# Patient Record
Sex: Male | Born: 1990 | Race: White | Hispanic: No | Marital: Single | Smoking: Former smoker
Health system: Southern US, Community
[De-identification: ages and names within clinical notes are randomized; demographics above are authoritative.]

## PROBLEM LIST (undated history)

## (undated) DIAGNOSIS — F909 Attention-deficit hyperactivity disorder, unspecified type: Secondary | ICD-10-CM

## (undated) DIAGNOSIS — T7840XA Allergy, unspecified, initial encounter: Secondary | ICD-10-CM

## (undated) DIAGNOSIS — F419 Anxiety disorder, unspecified: Secondary | ICD-10-CM

## (undated) HISTORY — DX: Anxiety disorder, unspecified: F41.9

## (undated) HISTORY — DX: Attention-deficit hyperactivity disorder, unspecified type: F90.9

## (undated) HISTORY — PX: EAR TUBE REMOVAL: SHX1486

## (undated) HISTORY — DX: Allergy, unspecified, initial encounter: T78.40XA

---

## 2018-07-21 DEATH — deceased

## 2019-09-06 ENCOUNTER — Ambulatory Visit (INDEPENDENT_AMBULATORY_CARE_PROVIDER_SITE_OTHER): Payer: BC Managed Care – PPO | Admitting: Internal Medicine

## 2019-09-06 ENCOUNTER — Encounter: Payer: Self-pay | Admitting: Internal Medicine

## 2019-09-06 ENCOUNTER — Other Ambulatory Visit: Payer: Self-pay

## 2019-09-06 VITALS — BP 112/72 | HR 67 | Ht 74.0 in | Wt 180.8 lb

## 2019-09-06 DIAGNOSIS — J3089 Other allergic rhinitis: Secondary | ICD-10-CM

## 2019-09-06 DIAGNOSIS — B354 Tinea corporis: Secondary | ICD-10-CM | POA: Diagnosis not present

## 2019-09-06 DIAGNOSIS — Z23 Encounter for immunization: Secondary | ICD-10-CM

## 2019-09-06 DIAGNOSIS — T781XXA Other adverse food reactions, not elsewhere classified, initial encounter: Secondary | ICD-10-CM

## 2019-09-06 MED ORDER — NYSTATIN 100000 UNIT/GM EX OINT: 1.0000 "application " | TOPICAL_OINTMENT | Freq: Two times a day (BID) | CUTANEOUS | 0 refills | Status: AC

## 2019-09-06 NOTE — Patient Instructions (Signed)
Continue Zyrtec daily  Can also use Flonase nasal spray daily as well

## 2019-09-06 NOTE — Progress Notes (Signed)
Date:  09/06/2019   Name:  Tony Hale   DOB:  03/16/1991   MRN:  [id number]   Chief Complaint: Establish Care (Flu shot. ), Fungus of Anus ([facility] treated in past with cream. Helped in the past. Itching on and off. No pain. ), and Allergies (Zyrtec helps. Went from seasonal allergies to year round. Has a lot of phlegm in the back of throat, nose running, and wet coughs. )  Sinus Problem This is a recurrent problem. There has been no fever. Associated symptoms include congestion, coughing and sinus pressure. Pertinent negatives include no chills, headaches, sneezing or sore throat. (Runny nose and post nasal drip) Treatments tried: anti-histamines.  Rash This is a recurrent problem. Location: rectum. The rash is characterized by itchiness. Associated symptoms include congestion, coughing and rhinorrhea. Pertinent negatives include no fatigue, fever or sore throat. Treatments tried: topical cream for fungus. The treatment provided significant relief. His past medical history is significant for allergies.    No results found for: CREATININE, BUN, NA, K, CL, CO2 No results found for: CHOL, HDL, LDLCALC, LDLDIRECT, TRIG, CHOLHDL No results found for: TSH No results found for: HGBA1C   Review of Systems  Constitutional: Negative for chills, fatigue and fever.  HENT: Positive for congestion, postnasal drip, rhinorrhea and sinus pressure. Negative for sinus pain, sneezing, sore throat and tinnitus.   Respiratory: Positive for cough. Negative for chest tightness and wheezing.   Cardiovascular: Negative for chest pain, palpitations and leg swelling.  Skin: Positive for rash.  Neurological: Negative for dizziness and headaches.  Psychiatric/Behavioral: Negative for dysphoric mood and sleep disturbance. The patient is not nervous/anxious.     There are no active problems to display for this patient.   Allergies  Allergen Reactions  . Alpha-Gal Anaphylaxis    Past Surgical History:   Procedure Laterality Date  . EAR TUBE REMOVAL      Social History   Tobacco Use  . Smoking status: Former Smoker    Packs/day: 0.15    Years: 2.00    Pack years: 0.30    Types: Cigarettes    Quit date: 2017    Years since quitting: 3.8  . Smokeless tobacco: Never Used  Substance Use Topics  . Alcohol use: Yes    Comment: occasional   . Drug use: Never     Medication list has been reviewed and updated.  Current Meds  Medication Sig  . cetirizine (ZYRTEC) 10 MG tablet Take 10 mg by mouth daily.  . Multiple Vitamins-Minerals (MULTIVITAMIN WITH MINERALS) tablet Take 1 tablet by mouth daily.    PHQ 2/9 Scores 09/06/2019  PHQ - 2 Score 0  PHQ- 9 Score 0    BP Readings from Last 3 Encounters:  09/06/19 112/72    Physical Exam Vitals signs and nursing note reviewed.  Constitutional:      General: He is not in acute distress.    Appearance: Normal appearance. He is well-developed.  HENT:     Head: Normocephalic and atraumatic.     Right Ear: Tympanic membrane is scarred.     Left Ear: Tympanic membrane is scarred.     Nose:     Right Sinus: No maxillary sinus tenderness or frontal sinus tenderness.     Left Sinus: No maxillary sinus tenderness or frontal sinus tenderness.     Mouth/Throat:     Mouth: Mucous membranes are moist.     Palate: No mass.     Pharynx: Oropharynx is clear.  Neck:     Musculoskeletal: Normal range of motion.     Vascular: No carotid bruit.  Cardiovascular:     Rate and Rhythm: Normal rate and regular rhythm.     Pulses: Normal pulses.     Heart sounds: Normal heart sounds. No murmur.  Pulmonary:     Effort: Pulmonary effort is normal. No respiratory distress.     Breath sounds: No wheezing or rhonchi.  Musculoskeletal: Normal range of motion.     Right lower leg: No edema.     Left lower leg: No edema.  Lymphadenopathy:     Cervical: No cervical adenopathy.  Skin:    General: Skin is warm and dry.     Capillary Refill: Capillary  refill takes less than 2 seconds.     Findings: No lesion or rash.  Neurological:     Mental Status: He is alert and oriented to person, place, and time.  Psychiatric:        Attention and Perception: Attention normal.        Mood and Affect: Mood normal.        Behavior: Behavior normal.        Thought Content: Thought content normal.     Wt Readings from Last 3 Encounters:  09/06/19 180 lb 12.8 oz (82 kg)    BP 112/72   Pulse 67   Ht 6\' 2"  (1.88 m)   Wt 180 lb 12.8 oz (82 kg)   SpO2 95%   BMI 23.21 kg/m   Assessment and Plan: 1. Tinea corporis Recurrent rectal rash - continue nystatin prn - nystatin ointment (MYCOSTATIN); Apply 1 application topically 2 (two) times daily. To rectal rash  Dispense: 30 g; Refill: 0  2. Environmental and seasonal allergies Recommend taking zyrtec daily Add Flonase spray daily as well  3. 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 Any errors are unintentional.  , MD [facility]  09/06/2019

## 2020-05-04 ENCOUNTER — Encounter: Payer: Self-pay | Admitting: Internal Medicine

## 2020-05-04 ENCOUNTER — Other Ambulatory Visit: Payer: Self-pay

## 2020-05-04 ENCOUNTER — Ambulatory Visit: Payer: BC Managed Care – PPO | Admitting: Internal Medicine

## 2020-05-04 VITALS — BP 112/74 | HR 58 | Temp 98.3°F | Ht 74.0 in | Wt 177.0 lb

## 2020-05-04 DIAGNOSIS — K1379 Other lesions of oral mucosa: Secondary | ICD-10-CM | POA: Diagnosis not present

## 2020-05-04 NOTE — Progress Notes (Signed)
Date:  05/04/2020   Name:  Tony Hale   DOB:  12/19/1990   MRN:  [id number]   Chief Complaint: Mouth Lesions (X2-3 months-had fever, come up and go away within a week and comes back, in different spots every time, gets big and shrinks, has 2-3 today, painful )  HPI    Review of Systems  Constitutional: Negative for chills, fatigue and fever.  HENT: Positive for mouth sores (several episodes over the past few months). Negative for trouble swallowing.   Respiratory: Negative for chest tightness and shortness of breath.   Cardiovascular: Negative for chest pain and palpitations.  Neurological: Negative for dizziness and headaches.    Patient Active Problem List   Diagnosis Date Noted  . Tinea corporis 09/06/2019  . Environmental and seasonal allergies 09/06/2019  . 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 Alpha-Gal Anaphylaxis    Past Surgical History:  Procedure Laterality Date  . EAR TUBE REMOVAL      Social History   Tobacco Use  . Smoking status: Former Smoker    Packs/day: 0.15    Years: 2.00    Pack years: 0.30    Types: Cigarettes    Quit date: 2017    Years since quitting: 4.5  . Smokeless tobacco: Never Used  Vaping Use  . Vaping Use: Never used  Substance Use Topics  . Alcohol use: Yes    Comment: occasional   . Drug use: Never     Medication list has been reviewed and updated.  Current Meds  Medication Sig  . cetirizine (ZYRTEC) 10 MG tablet Take 10 mg by mouth daily.  . Digestive Enzymes (DIGESTIVE ENZYME PO) Take 1 capsule by mouth daily.  . Multiple Vitamins-Minerals (MULTIVITAMIN WITH MINERALS) tablet Take 1 tablet by mouth daily.  Marland Kitchen nystatin ointment (MYCOSTATIN) Apply 1 application topically 2 (two) times daily. To rectal rash  . Probiotic Product (PROBIOTIC DAILY PO) Take 1 capsule by mouth daily.    PHQ 2/9 Scores 05/04/2020 09/06/2019  PHQ - 2 Score 2 0  PHQ- 9 Score 2  0    GAD 7 : Generalized Anxiety Score 05/04/2020  Nervous, Anxious, on Edge 1  Control/stop worrying 0  Worry too much - different things 0  Trouble relaxing 0  Restless 0  Easily annoyed or irritable 0  Afraid - awful might happen 0  Total GAD 7 Score 1  Anxiety Difficulty Not difficult at all    BP Readings from Last 3 Encounters:  05/04/20 112/74  09/06/19 112/72    Physical Exam Vitals and nursing note reviewed.  Constitutional:      General: He is not in acute distress.    Appearance: Normal appearance. He is well-developed.  HENT:     Head: Normocephalic and atraumatic.     Mouth/Throat:     Mouth: Oral lesions (three apthous ulcers - ) present.      Comments: And right buccal mucosa Cardiovascular:     Rate and Rhythm: Normal rate and regular rhythm.  Pulmonary:     Effort: Pulmonary effort is normal. No respiratory distress.  Musculoskeletal:        General: Normal range of motion.     Cervical back: Normal range of motion.  Lymphadenopathy:     Cervical: No cervical adenopathy.  Skin:    General: Skin is warm and dry.     Findings: No rash.  Neurological:     Mental Status:  He is alert and oriented to person, place, and time.  Psychiatric:        Behavior: Behavior normal.        Thought Content: Thought content normal.     Wt Readings from Last 3 Encounters:  05/04/20 177 lb (80.3 kg)  09/06/19 180 lb 12.8 oz (82 kg)    BP 112/74   Pulse (!) 58   Temp 98.3 F (36.8 C) (Oral)   Ht 6\' 2"  (1.88 m)   Wt 177 lb (80.3 kg)   SpO2 99%   BMI 22.73 kg/m   Assessment and Plan: 1. Recurrent mouth ulceration Almost certain apthous ulcers but since there is one on his lip, will get labs to rule out HSV Local care only recommended - HSV(herpes simplex vrs) 1+2 ab-IgG   Partially dictated using . Any errors are unintentional.  Animal nutritionist, MD [facility]  05/04/2020

## 2020-05-05 LAB — HSV(HERPES SIMPLEX VRS) I + II AB-IGG
HSV 1 Glycoprotein G Ab, IgG: 15.5 index — ABNORMAL HIGH (ref 0.00–0.90)
HSV 2 IgG, Type Spec: <0.91 index (ref 0.00–0.90)

## 2020-05-05 NOTE — Progress Notes (Signed)
Tried calling the pt back.   1. HSV can lie dormant for many years and come out for many reasons later in life.   2. It would be a good idea to have his wife tested to be sure. She could be a carrier and have no symptoms.  3. This is a life long diagnosis, and does not go away but medication helps symptoms and break outs.

## 2020-05-11 ENCOUNTER — Encounter: Payer: Self-pay | Admitting: Internal Medicine

## 2020-05-11 ENCOUNTER — Other Ambulatory Visit: Payer: Self-pay

## 2020-05-11 ENCOUNTER — Other Ambulatory Visit: Payer: Self-pay | Admitting: Internal Medicine

## 2020-05-11 DIAGNOSIS — B009 Herpesviral infection, unspecified: Secondary | ICD-10-CM | POA: Insufficient documentation

## 2020-05-11 MED ORDER — VALACYCLOVIR HCL 1 G PO TABS: 1000.0000 mg | ORAL_TABLET | Freq: Two times a day (BID) | ORAL | 5 refills | Status: DC

## 2020-05-11 NOTE — Progress Notes (Signed)
Pt wants Valtrex to be sent in.  KP

## 2020-08-15 ENCOUNTER — Other Ambulatory Visit: Payer: Self-pay

## 2020-08-15 ENCOUNTER — Ambulatory Visit
Admission: RE | Admit: 2020-08-15 | Discharge: 2020-08-15 | Disposition: A | Discharge status: Home / Self Care | Payer: BC Managed Care – PPO | Source: Ambulatory Visit | Attending: Family Medicine | Admitting: Family Medicine

## 2020-08-15 VITALS — BP 144/83 | HR 68 | Temp 98.4°F | Resp 18 | Ht 74.0 in | Wt 175.0 lb

## 2020-08-15 DIAGNOSIS — M542 Cervicalgia: Secondary | ICD-10-CM

## 2020-08-15 DIAGNOSIS — N50811 Right testicular pain: Secondary | ICD-10-CM | POA: Diagnosis not present

## 2020-08-15 LAB — URINALYSIS, COMPLETE (UACMP) WITH MICROSCOPIC
Bilirubin Urine: NEGATIVE
Glucose, UA: NEGATIVE mg/dL
Hgb urine dipstick: NEGATIVE
Ketones, ur: NEGATIVE mg/dL
Leukocytes,Ua: NEGATIVE
Nitrite: NEGATIVE
Protein, ur: NEGATIVE mg/dL
Specific Gravity, Urine: >1.03 — ABNORMAL HIGH (ref 1.005–1.030)
pH: 6.5 (ref 5.0–8.0)

## 2020-08-15 MED ORDER — NAPROXEN 500 MG PO TABS: 500.0000 mg | ORAL_TABLET | Freq: Two times a day (BID) | ORAL | 0 refills | Status: AC | PRN

## 2020-08-15 NOTE — Discharge Instructions (Addendum)
Finish your antibiotic.  Medication as needed for pain/discomfort.  Your exam and urine were normal today.  Take care  Dr. Adriana Simas

## 2020-08-15 NOTE — ED Triage Notes (Signed)
Patient in today c/o right sided neck pain x 1 day and right testicular pain x today. Patient is currently being treated for a UTI with Amoxicillin, which he will finish tomorrow.

## 2020-08-15 NOTE — ED Provider Notes (Signed)
[facility]    CSN: [id number] Arrival date & time: 08/15/20  1819      History   Chief Complaint Chief Complaint  Patient presents with  . Appointment  . Neck Pain    right side  . Testicle Pain    right    HPI  29 year old male presents with multiple complaints.  Patient states that approximately 1 week to 1.5 weeks ago, he was seen at a doctor's office and was diagnosed with UTI.  He states that he was treated with amoxicillin.  The results of his urine culture are unknown.  Records are unavailable to [place] at this time.  Patient reports that he does not feel well.  He reports fatigue, anterior neck pain, pain in the left axilla.  And also has right testicle pain.  No documented fever.  He endorses compliance with his antibiotic regimen and states that he is almost done.  Patient reports that he intermittently still has some urinary symptoms.    Past Medical History:  Diagnosis Date  . ADHD (attention deficit hyperactivity disorder)   . Allergy   . Anxiety     Patient Active Problem List   Diagnosis Date Noted  . 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 Tinea corporis 09/06/2019  . Environmental and seasonal allergies 09/06/2019  . 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 EAR TUBE REMOVAL         Home Medications    Prior to Admission medications   Medication Sig Start Date End Date Taking? Authorizing Provider  amoxicillin (AMOXIL) 500 MG capsule Take 500 mg by mouth 2 (two) times daily. 08/10/20  Yes [provider]  cetirizine (ZYRTEC) 10 MG tablet Take 10 mg by mouth daily.   Yes [provider]  Digestive Enzymes (DIGESTIVE ENZYME PO) Take 1 capsule by mouth daily.   Yes [provider]  Multiple Vitamins-Minerals (MULTIVITAMIN WITH MINERALS) tablet Take 1 tablet by mouth daily.   Yes [provider]  nystatin ointment (MYCOSTATIN) Apply 1  application topically 2 (two) times daily. To rectal rash 09/06/19  Yes Reubin Milan, MD  Probiotic Product (PROBIOTIC DAILY PO) Take 1 capsule by mouth daily.   Yes [provider]  naproxen (NAPROSYN) 500 MG tablet Take 1 tablet (500 mg total) by mouth 2 (two) times daily as needed for mild pain or moderate pain. 08/15/20   Tommie Sams, DO    Family History Family History  Problem Relation Age of Onset  . Anxiety disorder Mother   . ADD / ADHD Mother   . Arthritis Mother   . Cirrhosis Father   . Alcohol abuse Father     Social History Social History   Tobacco Use  . Smoking status: Former Smoker    Packs/day: 0.15    Years: 2.00    Pack years: 0.30    Types: Cigarettes    Quit date: 2017    Years since quitting: 4.8  . Smokeless tobacco: Never Used  Vaping Use  . Vaping Use: Former  . Quit date: 08/01/2020  . Substances: CBD  Substance Use Topics  . Alcohol use: Yes    Comment: occasional   . Drug use: Never     Allergies   Alpha-gal   Review of Systems Review of Systems Per HPI  Physical Exam Triage Vital Signs ED Triage Vitals  Enc Vitals Group     BP 08/15/20 1841 (!) 144/83  Pulse Rate 08/15/20 1841 68     Resp 08/15/20 1841 18     Temp 08/15/20 1841 98.4 F (36.9 C)     Temp Source 08/15/20 1841 Oral     SpO2 08/15/20 1841 100 %     Weight 08/15/20 1841 175 lb (79.4 kg)     Height 08/15/20 1841 6\' 2"  (1.88 m)     Head Circumference --      Peak Flow --      Pain Score 08/15/20 1840 7     Pain Loc --      Pain Edu? --      Excl. in GC? --    No data found.  Updated Vital Signs BP (!) 144/83 (BP Location: Left Arm)   Pulse 68   Temp 98.4 F (36.9 C) (Oral)   Resp 18   Ht 6\' 2"  (1.88 m)   Wt 79.4 kg   SpO2 100%   BMI 22.47 kg/m   Visual Acuity Right Eye Distance:   Left Eye Distance:   Bilateral Distance:    Right Eye Near:   Left Eye Near:    Bilateral Near:     Physical Exam Vitals and nursing note  reviewed.  Constitutional:      General: He is not in acute distress.    Appearance: Normal appearance. He is not ill-appearing.  HENT:     Head: Normocephalic and atraumatic.     Mouth/Throat:     Pharynx: Oropharynx is clear. No oropharyngeal exudate or posterior oropharyngeal erythema.  Eyes:     General:        Right eye: No discharge.        Left eye: No discharge.     Conjunctiva/sclera: Conjunctivae normal.  Cardiovascular:     Rate and Rhythm: Normal rate and regular rhythm.     Heart sounds: No murmur heard.   Pulmonary:     Effort: Pulmonary effort is normal.     Breath sounds: Normal breath sounds. No wheezing, rhonchi or rales.  Genitourinary:    Testes: Normal.  Musculoskeletal:     Cervical back: Neck supple.  Lymphadenopathy:     Cervical: No cervical adenopathy.  Neurological:     Mental Status: He is alert.  Psychiatric:     Comments: Anxious.     UC Treatments / Results  Labs (all labs ordered are listed, but only abnormal results are displayed) Labs Reviewed  URINALYSIS, COMPLETE (UACMP) WITH MICROSCOPIC - Abnormal; Notable for the following components:      Result Value   Specific Gravity, Urine >1.030 (*)    Bacteria, UA RARE (*)    All other components within normal limits    EKG   Radiology No results found.  Procedures Procedures (including critical care time)  Medications Ordered in UC Medications - No data to display  Initial Impression / Assessment and Plan / UC Course  I have reviewed the triage vital signs and the nursing notes.  Pertinent labs & imaging results that were available during my care of the patient were reviewed by me and considered in my medical decision making (see chart for details).    29 year old male presents with multiple complaints.  Normal exam today.  Normal urinalysis.  Advised to complete antibiotic. Supportive care.  Final Clinical Impressions(s) / UC Diagnoses   Final diagnoses:  Neck pain    Pain in right testicle     Discharge Instructions     Finish your antibiotic.  Medication as needed for pain/discomfort.  Your exam and urine were normal today.  Take care  Dr. Adriana Simas    ED Prescriptions    Medication Sig Dispense Auth. Provider   naproxen (NAPROSYN) 500 MG tablet Take 1 tablet (500 mg total) by mouth 2 (two) times daily as needed for mild pain or moderate pain. 30 tablet Tommie Sams, DO     PDMP not reviewed this encounter.   Tommie Sams, DO 08/15/20 2250

## 2021-02-25 ENCOUNTER — Emergency Department: Payer: BC Managed Care – PPO

## 2021-02-25 ENCOUNTER — Emergency Department
Admission: EM | Admit: 2021-02-25 | Discharge: 2021-02-25 | Disposition: A | Discharge status: Home / Self Care | Payer: BC Managed Care – PPO | Attending: Emergency Medicine | Admitting: Emergency Medicine

## 2021-02-25 DIAGNOSIS — S5012XA Contusion of left forearm, initial encounter: Secondary | ICD-10-CM | POA: Diagnosis not present

## 2021-02-25 DIAGNOSIS — S161XXA Strain of muscle, fascia and tendon at neck level, initial encounter: Secondary | ICD-10-CM | POA: Diagnosis not present

## 2021-02-25 DIAGNOSIS — Y92410 Unspecified street and highway as the place of occurrence of the external cause: Secondary | ICD-10-CM | POA: Insufficient documentation

## 2021-02-25 DIAGNOSIS — M79671 Pain in right foot: Secondary | ICD-10-CM | POA: Insufficient documentation

## 2021-02-25 DIAGNOSIS — S199XXA Unspecified injury of neck, initial encounter: Secondary | ICD-10-CM | POA: Diagnosis present

## 2021-02-25 DIAGNOSIS — Z87891 Personal history of nicotine dependence: Secondary | ICD-10-CM | POA: Diagnosis not present
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
## 2021-02-25 NOTE — Discharge Instructions (Addendum)
Take ibuprofen 600 mg every 6 hours as needed for pain
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
